# Patient Record
Sex: Female | Born: 1935 | Marital: Single | Smoking: Never smoker
Health system: Southern US, Community
[De-identification: ages and names within clinical notes are randomized; demographics above are authoritative.]

## PROBLEM LIST (undated history)

## (undated) DIAGNOSIS — N039 Chronic nephritic syndrome with unspecified morphologic changes: Secondary | ICD-10-CM

## (undated) DIAGNOSIS — G4700 Insomnia, unspecified: Secondary | ICD-10-CM

## (undated) DIAGNOSIS — D631 Anemia in chronic kidney disease: Secondary | ICD-10-CM

## (undated) DIAGNOSIS — N189 Chronic kidney disease, unspecified: Secondary | ICD-10-CM

## (undated) DIAGNOSIS — F0280 Dementia in other diseases classified elsewhere without behavioral disturbance: Secondary | ICD-10-CM

## (undated) DIAGNOSIS — G309 Alzheimer's disease, unspecified: Secondary | ICD-10-CM

## (undated) DIAGNOSIS — M1990 Unspecified osteoarthritis, unspecified site: Secondary | ICD-10-CM

## (undated) DIAGNOSIS — I150 Renovascular hypertension: Secondary | ICD-10-CM

## (undated) HISTORY — DX: Insomnia, unspecified: G47.00

## (undated) HISTORY — DX: Anemia in chronic kidney disease: D63.1

## (undated) HISTORY — DX: Unspecified osteoarthritis, unspecified site: M19.90

## (undated) HISTORY — DX: Chronic kidney disease, unspecified: N18.9

## (undated) HISTORY — DX: Renovascular hypertension: I15.0

## (undated) HISTORY — DX: Chronic nephritic syndrome with unspecified morphologic changes: N03.9

## (undated) HISTORY — DX: Dementia in other diseases classified elsewhere without behavioral disturbance: F02.80

## (undated) HISTORY — DX: Hypercalcemia: E83.52

## (undated) HISTORY — DX: Alzheimer's disease, unspecified: G30.9

---

## 2012-11-22 ENCOUNTER — Non-Acute Institutional Stay (SKILLED_NURSING_FACILITY): Payer: Medicare Other | Admitting: Adult Health

## 2012-11-22 DIAGNOSIS — M1990 Unspecified osteoarthritis, unspecified site: Secondary | ICD-10-CM

## 2012-11-22 DIAGNOSIS — G309 Alzheimer's disease, unspecified: Secondary | ICD-10-CM

## 2012-11-22 DIAGNOSIS — I150 Renovascular hypertension: Secondary | ICD-10-CM

## 2012-11-22 DIAGNOSIS — F411 Generalized anxiety disorder: Secondary | ICD-10-CM

## 2012-11-22 DIAGNOSIS — G4700 Insomnia, unspecified: Secondary | ICD-10-CM

## 2012-11-22 DIAGNOSIS — F419 Anxiety disorder, unspecified: Secondary | ICD-10-CM

## 2013-01-02 ENCOUNTER — Non-Acute Institutional Stay (SKILLED_NURSING_FACILITY): Payer: Medicare Other | Admitting: Internal Medicine

## 2013-01-02 DIAGNOSIS — D631 Anemia in chronic kidney disease: Secondary | ICD-10-CM | POA: Insufficient documentation

## 2013-01-02 DIAGNOSIS — F0280 Dementia in other diseases classified elsewhere without behavioral disturbance: Secondary | ICD-10-CM | POA: Insufficient documentation

## 2013-01-02 DIAGNOSIS — N039 Chronic nephritic syndrome with unspecified morphologic changes: Secondary | ICD-10-CM

## 2013-01-02 DIAGNOSIS — G4700 Insomnia, unspecified: Secondary | ICD-10-CM

## 2013-01-02 DIAGNOSIS — I150 Renovascular hypertension: Secondary | ICD-10-CM

## 2013-01-02 DIAGNOSIS — G309 Alzheimer's disease, unspecified: Secondary | ICD-10-CM

## 2013-01-02 HISTORY — DX: Anemia in chronic kidney disease: D63.1

## 2013-01-02 HISTORY — DX: Insomnia, unspecified: G47.00

## 2013-01-02 HISTORY — DX: Dementia in other diseases classified elsewhere, unspecified severity, without behavioral disturbance, psychotic disturbance, mood disturbance, and anxiety: F02.80

## 2013-01-02 HISTORY — DX: Renovascular hypertension: I15.0

## 2013-01-02 NOTE — Progress Notes (Signed)
PROGRESS NOTE  DATE: 01/02/2013  FACILITY: Nursing Home Location: [facility]  LEVEL OF CARE: SNF (31)  Routine Visit  CHIEF COMPLAINT:  Manage Alzheimer's dementia, hypertension and insomnia  HISTORY OF PRESENT ILLNESS:  REASSESSMENT OF ONGOING PROBLEM(S):  1. DEMENTIA: The dementia remaines stable and continues to function adequately in the current living environment with supervision.  The patient has had little changes in behavior. No complications noted from the medications presently being used. Dementia is advanced.  Patient is a poor historian.  2. HTN: Pt 's HTN remains stable.  Staff Deny CP, sob, DOE, pedal edema, headaches, dizziness or visual disturbances.  No complications from the medications currently being used.  Last BP : 112/68.  3. INSOMNIA: The insomnia remains stable.  No complications noted from the medications presently being used. staff deny ongoing insomnia, pain, hallucinations, delusions.  PAST MEDICAL HISTORY : Reviewed.  No changes.  CURRENT MEDICATIONS: Reviewed per [facility]  REVIEW OF SYSTEMS: Unobtainable due to dementia.  PHYSICAL EXAMINATION  VS:  T 98.9      P 96      RR 18      BP 112/68     POX %     WT (Lb) 107  GENERAL: no acute distress, thin body habitus EYES: conjunctivae normal, sclerae normal, normal eye lids NECK: supple, trachea midline, no neck masses, no thyroid tenderness, no thyromegaly LYMPHATICS: no LAN in the neck, no supraclavicular LAN RESPIRATORY: breathing is even & unlabored, BS CTAB CARDIAC: RRR, no murmur,no extra heart sounds, no edema GI: abdomen soft, normal BS, no masses, no tenderness, no hepatomegaly, no splenomegaly PSYCHIATRIC: the patient is alert & disoriented, affect & behavior appropriate  LABS/RADIOLOGY:  2/14 creatinine 1.31, BUN 17, Depakote level 37 12/13 hemoglobin 11.6, MCV 81 otherwise CBC normal, creatinine 1.43 otherwise CMP normal  ASSESSMENT/PLAN:  1. Alzheimer's  dementia-advanced. 2. Hypertension-well controlled. 3. insomnia -continue trazodone. 4. anemia of chronic kidney disease-recheck hemoglobin level. 5. osteoarthritis-appears comfortable. 6. chronic kidney disease-stable. 7. anxiety-stable. 8. check CBC and CMP.  CPT CODE: 91478

## 2013-01-16 ENCOUNTER — Non-Acute Institutional Stay (SKILLED_NURSING_FACILITY): Payer: Medicare Other | Admitting: Internal Medicine

## 2013-01-16 DIAGNOSIS — N039 Chronic nephritic syndrome with unspecified morphologic changes: Secondary | ICD-10-CM

## 2013-01-16 DIAGNOSIS — D631 Anemia in chronic kidney disease: Secondary | ICD-10-CM

## 2013-01-16 DIAGNOSIS — N189 Chronic kidney disease, unspecified: Secondary | ICD-10-CM

## 2013-02-07 ENCOUNTER — Non-Acute Institutional Stay (SKILLED_NURSING_FACILITY): Payer: Medicare Other | Admitting: Adult Health

## 2013-02-07 DIAGNOSIS — G4700 Insomnia, unspecified: Secondary | ICD-10-CM

## 2013-02-07 DIAGNOSIS — F411 Generalized anxiety disorder: Secondary | ICD-10-CM

## 2013-02-07 DIAGNOSIS — M1990 Unspecified osteoarthritis, unspecified site: Secondary | ICD-10-CM

## 2013-02-07 DIAGNOSIS — F419 Anxiety disorder, unspecified: Secondary | ICD-10-CM

## 2013-02-07 DIAGNOSIS — I150 Renovascular hypertension: Secondary | ICD-10-CM

## 2013-02-07 DIAGNOSIS — G309 Alzheimer's disease, unspecified: Secondary | ICD-10-CM

## 2013-02-10 NOTE — Progress Notes (Signed)
Patient ID: Andrea Robbins, female   DOB: Jun 11, 1934, 79 y.o.   MRN: [id number]        PROGRESS NOTE  DATE: 01/16/2013  FACILITY:  [facility]  LEVEL OF CARE: SNF (31)  Acute Visit  CHIEF COMPLAINT:  Manage renal insufficiency and hypercalcemia.    HISTORY OF PRESENT ILLNESS: I was requested by the staff to assess the patient regarding above problem(s):  CHRONIC KIDNEY DISEASE: The patient's chronic kidney disease remains stable.  Staff denies increasing lower extremity swelling or confusion. Last BUN and creatinine are:  On 01/09/2013:  BUN 26, creatinine 1.39.  In 09/2012:  Creatinine 1.31.  Patient is a poor historian due to dementia.    HYPERCALCEMIA:  New problem.  On 01/09/2013:  Calcium 10.9.  In 08/2012:  Calcium 9.8.  The patient is  currently on Caltrate.    PAST MEDICAL HISTORY : Reviewed.  No changes.  CURRENT MEDICATIONS: Reviewed per [facility]  REVIEW OF SYSTEMS:  Unobtainable due to dementia.    PHYSICAL EXAMINATION  GENERAL: no acute distress, normal body habitus EYES: conjunctivae normal, sclerae normal, normal eye lids NECK: supple, trachea midline, no neck masses, no thyroid tenderness, no thyromegaly LYMPHATICS: no LAN in the neck, no supraclavicular LAN RESPIRATORY: breathing is even & unlabored, BS CTAB CARDIAC: RRR, no murmur,no extra heart sounds, no edema GI: abdomen soft, normal BS, no masses, no tenderness, no hepatomegaly, no splenomegaly PSYCHIATRIC: the patient is alert, disoriented, affect & behavior appropriate  LABS/RADIOLOGY: 01/09/2013:  MCV 78, hemoglobin 12.9.    07/2012:  Hemoglobin 11.6, MCV 81.  ASSESSMENT/PLAN:  Chronic kidney disease.  Stable.    Hypercalcemia.  New problem.  Discontinue Caltrate and reassess.  Microcytic anemia.   Check iron studies.    CPT CODE: 40981

## 2013-02-11 DIAGNOSIS — N189 Chronic kidney disease, unspecified: Secondary | ICD-10-CM | POA: Insufficient documentation

## 2013-02-11 HISTORY — DX: Hypercalcemia: E83.52

## 2013-02-11 HISTORY — DX: Chronic kidney disease, unspecified: N18.9

## 2013-02-28 ENCOUNTER — Other Ambulatory Visit: Payer: Self-pay | Admitting: Geriatric Medicine

## 2013-02-28 MED ORDER — LORAZEPAM 1 MG PO TABS: ORAL_TABLET | ORAL | Status: AC

## 2013-03-06 ENCOUNTER — Non-Acute Institutional Stay (SKILLED_NURSING_FACILITY): Payer: Medicare Other | Admitting: Internal Medicine

## 2013-03-06 DIAGNOSIS — G309 Alzheimer's disease, unspecified: Secondary | ICD-10-CM

## 2013-03-06 DIAGNOSIS — N039 Chronic nephritic syndrome with unspecified morphologic changes: Secondary | ICD-10-CM

## 2013-03-06 DIAGNOSIS — I150 Renovascular hypertension: Secondary | ICD-10-CM

## 2013-03-06 DIAGNOSIS — G4700 Insomnia, unspecified: Secondary | ICD-10-CM

## 2013-03-06 DIAGNOSIS — D631 Anemia in chronic kidney disease: Secondary | ICD-10-CM

## 2013-03-06 NOTE — Progress Notes (Signed)
PROGRESS NOTE  DATE: 03-06-13  FACILITY: Nursing Home [facility]: [facility]  LEVEL OF CARE: SNF (31)  Routine Visit  CHIEF COMPLAINT:  Manage Alzheimer's dementia, hypertension and insomnia  HISTORY OF PRESENT ILLNESS:  REASSESSMENT OF ONGOING PROBLEM(S):  DEMENTIA: The dementia remaines stable and continues to function adequately in the current living environment with supervision.  The patient has had little changes in behavior. No complications noted from the medications presently being used. Dementia is advanced.  Patient is a poor historian.  HTN: Pt 's HTN remains stable.  Staff Deny CP, sob, DOE, pedal edema, headaches, dizziness or visual disturbances.  No complications from the medications currently being used.  Last BP : 112/68, 152/72  INSOMNIA: The insomnia remains stable.  No complications noted from the medications presently being used. staff deny ongoing insomnia, pain, hallucinations, delusions.  PAST MEDICAL HISTORY : Reviewed.  No changes.  CURRENT MEDICATIONS: Reviewed per [facility]  REVIEW OF SYSTEMS: Unobtainable due to dementia.  PHYSICAL EXAMINATION  VS:  T 96.4      P 89      RR 20      BP 152/72     POX %     WT (Lb) 104  GENERAL: no acute distress, thin body habitus NECK: supple, trachea midline, no neck masses, no thyroid tenderness, no thyromegaly RESPIRATORY: breathing is even & unlabored, BS CTAB CARDIAC: RRR, no murmur,no extra heart sounds, no edema GI: abdomen soft, normal BS, no masses, no tenderness, no hepatomegaly, no splenomegaly PSYCHIATRIC: the patient is alert & disoriented, affect & behavior appropriate  LABS/RADIOLOGY:  7/14 depakote level 28 6/14 TIBC 234 ow iron panel nl, mcv 78 ow cbc nl, cr 1.39, Ca 10.9 ow cmp nl  2/14 creatinine 1.31, BUN 17, Depakote level 37 12/13 hemoglobin 11.6, MCV 81 otherwise CBC normal, creatinine 1.43 otherwise CMP normal  ASSESSMENT/PLAN:  Alzheimer's dementia-advanced.  Depakote  was increased. Hypertension-BP elevated.  Will review a BP log. insomnia -continue trazodone. anemia of chronic kidney disease- hemoglobin has normalized. osteoarthritis-appears comfortable. chronic kidney disease-stable. anxiety-stable. Hypercalcemia-recheck.  CPT CODE: 16109

## 2013-04-05 ENCOUNTER — Non-Acute Institutional Stay (SKILLED_NURSING_FACILITY): Payer: Medicare Other | Admitting: Internal Medicine

## 2013-04-05 DIAGNOSIS — F0280 Dementia in other diseases classified elsewhere without behavioral disturbance: Secondary | ICD-10-CM

## 2013-04-05 DIAGNOSIS — G4700 Insomnia, unspecified: Secondary | ICD-10-CM

## 2013-04-05 DIAGNOSIS — I150 Renovascular hypertension: Secondary | ICD-10-CM

## 2013-04-05 DIAGNOSIS — M1990 Unspecified osteoarthritis, unspecified site: Secondary | ICD-10-CM

## 2013-04-06 DIAGNOSIS — M1990 Unspecified osteoarthritis, unspecified site: Secondary | ICD-10-CM | POA: Insufficient documentation

## 2013-04-06 HISTORY — DX: Unspecified osteoarthritis, unspecified site: M19.90

## 2013-04-06 NOTE — Progress Notes (Signed)
PROGRESS NOTE  DATE: 04-05-13  FACILITY: Nursing Home Location: [facility]  LEVEL OF CARE: SNF (31)  Routine Visit  CHIEF COMPLAINT:  Manage Alzheimer's dementia, hypertension and insomnia  HISTORY OF PRESENT ILLNESS:  REASSESSMENT OF ONGOING PROBLEM(S):  DEMENTIA: The dementia remaines stable and continues to function adequately in the current living environment with supervision.  The patient has had little changes in behavior. No complications noted from the medications presently being used. Dementia is advanced.  Patient is a poor historian.  HTN: Pt 's HTN remains stable.  Staff Deny CP, sob, DOE, pedal edema, headaches, dizziness or visual disturbances.  No complications from the medications currently being used.  Last BP : 112/68, 152/72, 110/80  INSOMNIA: The insomnia remains stable.  No complications noted from the medications presently being used. staff deny ongoing insomnia, pain, hallucinations, delusions.  PAST MEDICAL HISTORY : Reviewed.  No changes.  CURRENT MEDICATIONS: Reviewed per [facility]  REVIEW OF SYSTEMS: Unobtainable due to dementia.  PHYSICAL EXAMINATION  VS:  T 97.3      P 81      RR 18      BP 110/80     POX %     WT (Lb) 107  GENERAL: no acute distress, thin body habitus NECK: supple, trachea midline, no neck masses, no thyroid tenderness, no thyromegaly RESPIRATORY: breathing is even & unlabored, BS CTAB CARDIAC: RRR, no murmur,no extra heart sounds, no edema GI: abdomen soft, normal BS, no masses, no tenderness, no hepatomegaly, no splenomegaly PSYCHIATRIC: the patient is alert & disoriented, affect & behavior appropriate  LABS/RADIOLOGY:  7/14 depakote level 28 6/14 TIBC 234 ow iron panel nl, mcv 78 ow cbc nl, cr 1.39, Ca 10.9 ow cmp nl  2/14 creatinine 1.31, BUN 17, Depakote level 37 12/13 hemoglobin 11.6, MCV 81 otherwise CBC normal, creatinine 1.43 otherwise CMP normal  ASSESSMENT/PLAN:  Alzheimer's dementia-advanced.  Seroquel was started. Hypertension-well controlled. insomnia -continue trazodone. osteoarthritis-appears comfortable. chronic kidney disease-stable. anxiety-stable. Hypercalcemia-recheck.  CPT CODE: 86578

## 2013-04-30 ENCOUNTER — Non-Acute Institutional Stay (SKILLED_NURSING_FACILITY): Payer: Medicare Other | Admitting: Adult Health

## 2013-04-30 DIAGNOSIS — M1990 Unspecified osteoarthritis, unspecified site: Secondary | ICD-10-CM

## 2013-04-30 DIAGNOSIS — M79609 Pain in unspecified limb: Secondary | ICD-10-CM

## 2013-04-30 DIAGNOSIS — M79604 Pain in right leg: Secondary | ICD-10-CM

## 2013-05-09 ENCOUNTER — Non-Acute Institutional Stay (SKILLED_NURSING_FACILITY): Payer: Medicare Other | Admitting: Internal Medicine

## 2013-05-09 DIAGNOSIS — M1990 Unspecified osteoarthritis, unspecified site: Secondary | ICD-10-CM

## 2013-05-09 DIAGNOSIS — I150 Renovascular hypertension: Secondary | ICD-10-CM

## 2013-05-09 DIAGNOSIS — G4700 Insomnia, unspecified: Secondary | ICD-10-CM

## 2013-05-09 DIAGNOSIS — F0280 Dementia in other diseases classified elsewhere without behavioral disturbance: Secondary | ICD-10-CM

## 2013-05-09 NOTE — Progress Notes (Signed)
PROGRESS NOTE  DATE: 05-09-13  FACILITY: Nursing Home [facility]: [facility]  LEVEL OF CARE: SNF (31)  Routine Visit  CHIEF COMPLAINT:  Manage Alzheimer's dementia, hypertension and insomnia  HISTORY OF PRESENT ILLNESS:  REASSESSMENT OF ONGOING PROBLEM(S):  DEMENTIA: The dementia remaines stable and continues to function adequately in the current living environment with supervision.  The patient has had little changes in behavior. No complications noted from the medications presently being used. Dementia is advanced.  Patient is a poor historian.  HTN: Pt 's HTN remains stable.  Staff Deny CP, sob, DOE, pedal edema, headaches, dizziness or visual disturbances.  No complications from the medications currently being used.  Last BP : 112/68, 152/72, 110/80, 128/92  INSOMNIA: The insomnia remains stable.  No complications noted from the medications presently being used. staff deny ongoing insomnia, pain, hallucinations, delusions.  PAST MEDICAL HISTORY : Reviewed.  No changes.  CURRENT MEDICATIONS: Reviewed per [facility]  REVIEW OF SYSTEMS: Unobtainable due to dementia.  PHYSICAL EXAMINATION  VS:  T 97.3      P 86      RR 16      BP 128/92    POX %     WT (Lb) 112  GENERAL: no acute distress, thin body habitus NECK: supple, trachea midline, no neck masses, no thyroid tenderness, no thyromegaly RESPIRATORY: breathing is even & unlabored, BS CTAB CARDIAC: RRR, no murmur,no extra heart sounds, no edema GI: abdomen soft, normal BS, no masses, no tenderness, no hepatomegaly, no splenomegaly PSYCHIATRIC: the patient is alert & disoriented, affect & behavior appropriate  LABS/RADIOLOGY:  8/14 Ca 10.2  7/14 depakote level 28 6/14 TIBC 234 ow iron panel nl, mcv 78 ow cbc nl, cr 1.39, Ca 10.9 ow cmp nl  2/14 creatinine 1.31, BUN 17, Depakote level 37 12/13 hemoglobin 11.6, MCV 81 otherwise CBC normal, creatinine 1.43 otherwise CMP  normal  ASSESSMENT/PLAN:  Alzheimer's dementia-advanced.  Hypertension-well controlled. insomnia -continue trazodone. osteoarthritis-appears comfortable. chronic kidney disease-stable. anxiety-stable.  CPT CODE: 40981

## 2013-06-13 ENCOUNTER — Non-Acute Institutional Stay (SKILLED_NURSING_FACILITY): Payer: Medicare Other | Admitting: Internal Medicine

## 2013-06-13 DIAGNOSIS — G4700 Insomnia, unspecified: Secondary | ICD-10-CM

## 2013-06-13 DIAGNOSIS — M1990 Unspecified osteoarthritis, unspecified site: Secondary | ICD-10-CM

## 2013-06-13 DIAGNOSIS — I150 Renovascular hypertension: Secondary | ICD-10-CM

## 2013-06-13 DIAGNOSIS — F0280 Dementia in other diseases classified elsewhere without behavioral disturbance: Secondary | ICD-10-CM

## 2013-06-15 ENCOUNTER — Encounter: Payer: Self-pay | Admitting: Internal Medicine

## 2013-06-15 NOTE — Addendum Note (Signed)
Addended by: Angela Cox on: 06/15/2013 12:33 PM   Modules accepted: Orders

## 2013-06-15 NOTE — Progress Notes (Signed)
PROGRESS NOTE  DATE: 06-13-13  FACILITY: Nursing Home [facility]: [facility]  LEVEL OF CARE: SNF (31)  Routine Visit  CHIEF COMPLAINT:  Manage Alzheimer's dementia, hypertension and insomnia  HISTORY OF PRESENT ILLNESS:  REASSESSMENT OF ONGOING PROBLEM(S):  DEMENTIA: The dementia remaines stable and continues to function adequately in the current living environment with supervision.  The patient has had little changes in behavior. No complications noted from the medications presently being used. Dementia is advanced.  Patient is a poor historian.  HTN: Pt 's HTN remains stable.  Staff Deny CP, sob, DOE, pedal edema, headaches, dizziness or visual disturbances.  No complications from the medications currently being used.  Last BP : 112/68, 152/72, 110/80, 128/92, 136/86 he  INSOMNIA: The insomnia remains stable.  No complications noted from the medications presently being used. staff deny ongoing insomnia, pain, hallucinations, delusions.  PAST MEDICAL HISTORY : Reviewed.  No changes.  CURRENT MEDICATIONS: Reviewed per [facility]  REVIEW OF SYSTEMS: Unobtainable due to dementia.  PHYSICAL EXAMINATION  VS:  T 97.2     P 82      RR 18     BP 136/86   POX %     WT (Lb) 112  GENERAL: no acute distress, thin body habitus NECK: supple, trachea midline, no neck masses, no thyroid tenderness, no thyromegaly RESPIRATORY: breathing is even & unlabored, BS CTAB CARDIAC: RRR, no murmur,no extra heart sounds, no edema GI: abdomen soft, normal BS, no masses, no tenderness, no hepatomegaly, no splenomegaly PSYCHIATRIC: the patient is alert & disoriented, affect & behavior appropriate  LABS/RADIOLOGY:  8/14 Ca 10.2  7/14 depakote level 28 6/14 TIBC 234 ow iron panel nl, mcv 78 ow cbc nl, cr 1.39, Ca 10.9 ow cmp nl  2/14 creatinine 1.31, BUN 17, Depakote level 37 12/13 hemoglobin 11.6, MCV 81 otherwise CBC normal, creatinine 1.43 otherwise CMP  normal  ASSESSMENT/PLAN:  Alzheimer's dementia-advanced.  Hypertension-well controlled. insomnia -continue trazodone. osteoarthritis-appears comfortable. chronic kidney disease-stable. anxiety-stable.  CPT CODE: 16109

## 2013-07-01 ENCOUNTER — Encounter: Payer: Self-pay | Admitting: Adult Health

## 2013-07-01 NOTE — Progress Notes (Signed)
Patient ID: Andrea Robbins, female   DOB: [birth year], 79 y.o.   MRN: [id number]     [facility]  Allergies no known allergies  Chief Complaint  Patient presents with  . Medical Managment of Chronic Issues    HPI  She is being seen for the management of her chronic illnesses. There have been no recent changes in her overall status. There are no concerns being voiced by the nursing staff at this time. She is unable to full participate in the hpi or ros.    Past Medical History  Diagnosis Date  . Secondary renovascular hypertension, benign   . Alzheimer's disease   . Osteoarthrosis, unspecified whether generalized or localized, unspecified site   . Chronic kidney disease, unspecified   . Anemia in chronic kidney disease(285.21)   . Hypercalcemia   . Insomnia, unspecified   '  No past surgical history on file.  Filed Vitals:   11/22/12 1227  BP: 118/76  Pulse: 60  Weight: 107 lb (48.535 kg)    MEDICATIONS  Verapamil er 360 mg daily mobic 15 mg diay mvi daiyl Ca++ 600/400 twice daily depakote sprinkles 250 mg twice daily Ativan 1 mg twice daily and 0.5 mg daily prn  Trazodone 50 mg nightly and may repeat prn  LABS REVIEWED:   07-19-12: wbc 6.0; hgb 11.6; hct 35.7 ;mcv 81; plt 312; glucose 74; bun 22; creat 1.45; k+4.5; na++144 Liver normal albumin 3.7 09-12-12: depakote 37   Review of Systems  Unable to perform ROS   Physical Exam  Constitutional: No distress.  frail  Neck: Neck supple. No JVD present.  Cardiovascular: Normal rate, regular rhythm and intact distal pulses.   Respiratory: Effort normal and breath sounds normal. No respiratory distress. She has no wheezes.  GI: Soft. Bowel sounds are normal. She exhibits no distension.  Musculoskeletal: Normal range of motion.  Neurological: She is alert.  Skin: Skin is warm and dry. She is not diaphoretic.    ASSESSMENT/PLAN  1. Hypertension: is stable will continue verapamil er 360 mg daily and will  monitor her status   2. Osteoarthritis: she is presently being managed will continue her mobic 15 mg daily and will monitor   3. Alzheimer's disease: is without change in status; will continue her depakote 250 mg twice daily to help stabilize her mood and will monitor her status  4. Anxiety: will continue ativan 1 mg twice daily and 0.5 mg daily as needed  5. Insomnia: will continue trazodone 50 mg nightly with an as needed dose of 50 mg

## 2013-07-04 ENCOUNTER — Non-Acute Institutional Stay (SKILLED_NURSING_FACILITY): Payer: Medicare Other | Admitting: Internal Medicine

## 2013-07-04 DIAGNOSIS — G4700 Insomnia, unspecified: Secondary | ICD-10-CM

## 2013-07-04 DIAGNOSIS — M1990 Unspecified osteoarthritis, unspecified site: Secondary | ICD-10-CM

## 2013-07-04 DIAGNOSIS — F0280 Dementia in other diseases classified elsewhere without behavioral disturbance: Secondary | ICD-10-CM

## 2013-07-04 DIAGNOSIS — I150 Renovascular hypertension: Secondary | ICD-10-CM

## 2013-07-05 ENCOUNTER — Encounter: Payer: Self-pay | Admitting: Internal Medicine

## 2013-07-05 NOTE — Progress Notes (Signed)
PROGRESS NOTE  DATE: 07-04-13  FACILITY: Nursing Home [facility]: [facility]  LEVEL OF CARE: SNF (31)  Routine Visit  CHIEF COMPLAINT:  Manage Alzheimer's dementia, hypertension and insomnia  HISTORY OF PRESENT ILLNESS:  REASSESSMENT OF ONGOING PROBLEM(S):  DEMENTIA: The dementia remaines stable and continues to function adequately in the current living environment with supervision.  The patient has had little changes in behavior. No complications noted from the medications presently being used. Dementia is advanced.  Patient is a poor historian.  HTN: Pt 's HTN remains stable.  Staff Deny CP, sob, DOE, pedal edema, headaches, dizziness or visual disturbances.  No complications from the medications currently being used.  Last BP : 112/68, 152/72, 110/80, 128/92, 136/86  INSOMNIA: The insomnia remains stable.  No complications noted from the medications presently being used. staff deny ongoing insomnia, pain, hallucinations, delusions.  PAST MEDICAL HISTORY : Reviewed.  No changes.  CURRENT MEDICATIONS: Reviewed per [facility]  REVIEW OF SYSTEMS: Unobtainable due to dementia.  PHYSICAL EXAMINATION  VS:  T 97.2     P 82      RR 18     BP 136/86   POX %     WT (Lb) 112  GENERAL: no acute distress, thin body habitus NECK: supple, trachea midline, no neck masses, no thyroid tenderness, no thyromegaly RESPIRATORY: breathing is even & unlabored, BS CTAB CARDIAC: RRR, no murmur,no extra heart sounds, no edema GI: abdomen soft, normal BS, no masses, no tenderness, no hepatomegaly, no splenomegaly PSYCHIATRIC: the patient is alert & disoriented, affect & behavior appropriate  LABS/RADIOLOGY:  8/14 Ca 10.2  7/14 depakote level 28 6/14 TIBC 234 ow iron panel nl, mcv 78 ow cbc nl, cr 1.39, Ca 10.9 ow cmp nl  2/14 creatinine 1.31, BUN 17, Depakote level 37 12/13 hemoglobin 11.6, MCV 81 otherwise CBC normal, creatinine 1.43 otherwise CMP  normal  ASSESSMENT/PLAN:  Alzheimer's dementia-advanced.  Hypertension-well controlled. insomnia -continue trazodone. osteoarthritis-appears comfortable. chronic kidney disease-stable. Check creatinine level anxiety-stable. Check CBC and CMP  CPT CODE: 16109

## 2013-07-09 NOTE — Progress Notes (Signed)
Patient ID: Andrea Robbins, female   DOB: [birth year], 79 y.o.   MRN: [id number]      [facility]  Allergies no known allergies  Chief Complaint  Patient presents with  . Medical Managment of Chronic Issues   HPI  She is being seen for the management of her chronic illnesses. There have been no recent changes in her overall status. There are no concerns being voiced by the nursing staff at this time. She is unable to full participate in the hpi or ros.    Past Medical History  Diagnosis Date  . Secondary renovascular hypertension, benign   . Alzheimer's disease   . Osteoarthrosis, unspecified whether generalized or localized, unspecified site   . Chronic kidney disease, unspecified   . Anemia in chronic kidney disease(285.21)   . Hypercalcemia   . Insomnia, unspecified   '  No past surgical history on file.  Filed Vitals:   02/07/13 1348  BP: 142/89  Pulse: 83  Height: 5\' 5"  (1.651 m)  Weight: 104 lb (47.174 kg)     MEDICATIONS  Verapamil er 360 mg daily mobic 15 mg diay mvi daily depakote sprinkles 250 mg twice daily Ativan 1 mg twice daily and 0.5 mg daily prn    LABS REVIEWED:   07-19-12: wbc 6.0; hgb 11.6; hct 35.7 ;mcv 81; plt 312; glucose 74; bun 22; creat 1.45; k+4.5; na++144 Liver normal albumin 3.7 09-12-12: depakote 37  01-10-13: wbc 7.1; hgb 12.9; ct 40.3; mcv 78; plt 259; glucose 70; bun 26; creat 1.39; k+4.3; na++142 Liver normal albumin 4.1; ca++ 10.9     Review of Systems  Unable to perform ROS   Physical Exam  Constitutional: No distress.  frail  Neck: Neck supple. No JVD present.  Cardiovascular: Normal rate, regular rhythm and intact distal pulses.   Respiratory: Effort normal and breath sounds normal. No respiratory distress. She has no wheezes.  GI: Soft. Bowel sounds are normal. She exhibits no distension.  Musculoskeletal: Normal range of motion.  Neurological: She is alert.  Skin: Skin is warm and dry. She is not diaphoretic.     ASSESSMENT/PLAN  1. Hypertension: is stable will continue verapamil er 360 mg daily and will monitor her status   2. Osteoarthritis: she is presently being managed will continue her mobic 15 mg daily and will monitor   3. Alzheimer's disease: is without change in status; will continue her depakote 250 mg twice daily to help stabilize her mood and will monitor her status  4. Anxiety: will continue ativan 1 mg twice daily is presently stable   5. Insomnia: will not make changes is presently not on medications. Will monitor    6. Hypercalcemia: no change in her status; will continued to monitor

## 2013-07-11 ENCOUNTER — Non-Acute Institutional Stay (SKILLED_NURSING_FACILITY): Payer: Medicare Other | Admitting: Internal Medicine

## 2013-07-11 DIAGNOSIS — N189 Chronic kidney disease, unspecified: Secondary | ICD-10-CM

## 2013-07-11 NOTE — Progress Notes (Signed)
Patient ID: Andrea Robbins, female   DOB: 1934/07/15, 79 y.o.   MRN: [id number]     [facility]  Allergies no known allergies  Chief Complaint  Patient presents with  . Acute Visit    right leg pain    HPI Nursing has asked me to see her for her right leg pain. She is having increased difficulty participating in therapy she is resistant to movement of her leg. She does not want to stand on leg. There is no swelling or other signs of injury present. There are no known falls present.   Past Medical History  Diagnosis Date  . Secondary renovascular hypertension, benign 01/02/2013  . Alzheimer's disease 01/02/2013  . Osteoarthrosis, unspecified whether generalized or localized, unspecified site 04/06/2013  . Chronic kidney disease, unspecified 02/11/2013  . Anemia in chronic kidney disease(285.21) 01/02/2013  . Hypercalcemia 02/11/2013  . Insomnia, unspecified 01/02/2013    No past surgical history on file.  Filed Vitals:   04/30/13 2142  BP: 127/79  Pulse: 80  Height: 5\' 5"  (1.651 m)  Weight: 104 lb (47.174 kg)      MEDICATIONS  Verapamil er 360 mg daily mobic 15 mg diay mvi daily depakote sprinkles 250 mg twice daily Ativan 1 mg twice daily and 0.5 mg daily prn  Trazodone 25 mg tid prn   LABS REVIEWED:   07-19-12: wbc 6.0; hgb 11.6; hct 35.7 ;mcv 81; plt 312; glucose 74; bun 22; creat 1.45; k+4.5; na++144 Liver normal albumin 3.7 09-12-12: depakote 37  01-10-13: wbc 7.1; hgb 12.9; ct 40.3; mcv 78; plt 259; glucose 70; bun 26; creat 1.39; k+4.3; na++142 Liver normal albumin 4.1; ca++ 10.9  01-21-13: tibc 234; iron 45; ca++ 9.9; ferritin 258 02-06-13: depakote 28 03-14-13: ca++ 10.2    Review of Systems  Unable to perform ROS   Physical Exam  Constitutional: No distress.  frail  Neck: Neck supple. No JVD present.  Cardiovascular: Normal rate, regular rhythm and intact distal pulses.   Respiratory: Effort normal and breath sounds normal. No respiratory distress. She has  no wheezes.  GI: Soft. Bowel sounds are normal. She exhibits no distension.  Musculoskeletal: right leg is resistant to movement; does not want to stand on leg; there are no palpable abnormalities present. No edema present.  Neurological: She is alert.  Skin: Skin is warm and dry. She is not diaphoretic.   ASSESSMENT/PLAN  1. Right leg pain: will begin tylenol 1 gm tid and will continue to monitor her status; this pain is more than likely arthritic in nature.

## 2013-07-13 ENCOUNTER — Encounter: Payer: Self-pay | Admitting: Internal Medicine

## 2013-07-13 NOTE — Progress Notes (Signed)
PROGRESS NOTE  DATE: 07/11/2013  FACILITY:  [facility]  LEVEL OF CARE: SNF (31)  Acute Visit  CHIEF COMPLAINT:  Manage renal insufficiency  HISTORY OF PRESENT ILLNESS: I was requested by the staff to assess the patient regarding above problem(s):  CHRONIC KIDNEY DISEASE: The patient's chronic kidney disease remains stable.  Staff deny increasing lower extremity swelling or confusion. Last BUN and creatinine are: 27, 1.4 on 07-10-13.  In 6/14 creatinine 1.39. Patient is not on renal toxic medications. Patient is a poor historian due to dementia.  PAST MEDICAL HISTORY : Reviewed.  No changes.  CURRENT MEDICATIONS: Reviewed per [facility]  PHYSICAL EXAMINATION  GENERAL: no acute distress, thin body habitus RESPIRATORY: breathing is even & unlabored, BS CTAB CARDIAC: RRR, no murmur,no extra heart sounds, no edema  LABS/RADIOLOGY: See history of present illness  ASSESSMENT/PLAN:  Chronic kidney disease-renal function is worse. We will monitor.  CPT CODE: 08657

## 2013-08-15 ENCOUNTER — Non-Acute Institutional Stay (SKILLED_NURSING_FACILITY): Payer: Medicare Other | Admitting: Internal Medicine

## 2013-08-15 DIAGNOSIS — M1990 Unspecified osteoarthritis, unspecified site: Secondary | ICD-10-CM

## 2013-08-15 DIAGNOSIS — I150 Renovascular hypertension: Secondary | ICD-10-CM

## 2013-08-15 DIAGNOSIS — G309 Alzheimer's disease, unspecified: Principal | ICD-10-CM

## 2013-08-15 DIAGNOSIS — F0280 Dementia in other diseases classified elsewhere without behavioral disturbance: Secondary | ICD-10-CM

## 2013-08-15 DIAGNOSIS — G4700 Insomnia, unspecified: Secondary | ICD-10-CM

## 2013-08-15 NOTE — Progress Notes (Signed)
PROGRESS NOTE  DATE: 08-15-13  FACILITY: Nursing Home [facility]  LEVEL OF CARE: SNF (31)  Routine Visit  CHIEF COMPLAINT:  Manage Alzheimer's dementia, hypertension and insomnia  HISTORY OF PRESENT ILLNESS:  REASSESSMENT OF ONGOING PROBLEM(S):  DEMENTIA: The dementia remaines stable and continues to function adequately in the current living environment with supervision.  The patient has had little changes in behavior. No complications noted from the medications presently being used. Dementia is advanced.  Patient is a poor historian.  HTN: Pt 's HTN remains stable.  Staff Deny CP, sob, DOE, pedal edema, headaches, dizziness or visual disturbances.  No complications from the medications currently being used.  Last BP : 112/68, 152/72, 110/80, 128/92, 136/86,120/80  INSOMNIA: The insomnia remains stable.  No complications noted from the medications presently being used. staff deny ongoing insomnia, pain, hallucinations, delusions.  PAST MEDICAL HISTORY : Reviewed.  No changes.  [facility]S: [facility]  REVIEW OF SYSTEMS: Unobtainable due to dementia.  PHYSICAL EXAMINATION  VS:  T 97.3     P 72      RR 18     BP 120/80   POX %     WT (Lb) 115  GENERAL: no acute distress, thin body habitus NECK: supple, trachea midline, no neck masses, no thyroid tenderness, no thyromegaly RESPIRATORY: breathing is even & unlabored, BS CTAB CARDIAC: RRR, no murmur,no extra heart sounds, no edema GI: abdomen soft, normal BS, no masses, no tenderness, no hepatomegaly, no splenomegaly PSYCHIATRIC: the patient is alert & disoriented, affect & behavior appropriate  LABS/RADIOLOGY:  12-14 mcv 78 ow cbc nl, cr 1.4 ow cmp nl  8/14 Ca 10.2  7/14 depakote level 28 6/14 TIBC 234 ow iron panel nl, mcv 78 ow cbc nl, cr 1.39, Ca 10.9 ow cmp nl  2/14 creatinine 1.31, BUN 17, Depakote level 37 12/13 hemoglobin 11.6, MCV 81 otherwise CBC normal, creatinine  1.43 otherwise CMP normal  ASSESSMENT/PLAN:  Alzheimer's dementia-advanced. seroquel was d/cd Hypertension-well controlled. insomnia -continue trazodone. osteoarthritis-appears comfortable. chronic kidney disease-stable.  anxiety-stable. Check depakote level  CPT CODE: [id number]

## 2013-09-10 ENCOUNTER — Encounter: Payer: Self-pay | Admitting: *Deleted

## 2013-09-17 ENCOUNTER — Non-Acute Institutional Stay (SKILLED_NURSING_FACILITY): Payer: Medicare Other | Admitting: Internal Medicine

## 2013-09-17 DIAGNOSIS — N189 Chronic kidney disease, unspecified: Secondary | ICD-10-CM

## 2013-09-24 NOTE — Progress Notes (Signed)
Patient ID: Andrea Robbins, female   DOB: Aug 04, 1933, 80 y.o.   MRN: [id number]          PROGRESS NOTE  DATE: 09/17/2013    [facility]  LEVEL OF CARE: SNF (31)  Acute Visit  CHIEF COMPLAINT:  Manage renal insufficiency.    HISTORY OF PRESENT ILLNESS: I was requested by the staff to assess the patient regarding above problem(s):  On 09/10/2013:  BUN 24, creatinine 1.35.  In 07/2013:  Creatinine 1.4.  Patient is a poor historian due to dementia.    PAST MEDICAL HISTORY : Reviewed.  No changes.  CURRENT MEDICATIONS: [facility]  PHYSICAL EXAMINATION  VS:  T 96.8      P 74     RR 24     BP 144/98       WT (Lb) 114     GENERAL: no acute distress, thin body habitus RESPIRATORY: breathing is even & unlabored, BS CTAB CARDIAC: RRR, no murmur,no extra heart sounds, no edema  ASSESSMENT/PLAN:  Renal insufficiency.  Renal functions improved.    CPT CODE: [id number]     Angela CoxGayani Y Dorotha Hirschi, MD [facility] [phone]

## 2013-12-25 ENCOUNTER — Non-Acute Institutional Stay (SKILLED_NURSING_FACILITY): Payer: Medicare Other | Admitting: Internal Medicine

## 2013-12-25 DIAGNOSIS — G309 Alzheimer's disease, unspecified: Principal | ICD-10-CM

## 2013-12-25 DIAGNOSIS — F0280 Dementia in other diseases classified elsewhere without behavioral disturbance: Secondary | ICD-10-CM

## 2013-12-25 DIAGNOSIS — G4700 Insomnia, unspecified: Secondary | ICD-10-CM

## 2013-12-25 DIAGNOSIS — I150 Renovascular hypertension: Secondary | ICD-10-CM

## 2013-12-25 DIAGNOSIS — M1990 Unspecified osteoarthritis, unspecified site: Secondary | ICD-10-CM

## 2013-12-26 NOTE — Progress Notes (Signed)
        PROGRESS NOTE  DATE: 12-25-13  FACILITY: Nursing Home [facility]  LEVEL OF CARE: SNF (31)  Routine Visit  CHIEF COMPLAINT:  Manage Alzheimer's dementia, hypertension and insomnia  HISTORY OF PRESENT ILLNESS:  REASSESSMENT OF ONGOING PROBLEM(S):  DEMENTIA: The dementia remaines stable and continues to function adequately in the current living environment with supervision.  The patient has had little changes in behavior. No complications noted from the medications presently being used. Dementia is advanced.  Patient is a poor historian.  HTN: Pt 's HTN remains stable.  Staff Deny CP, sob, DOE, pedal edema, headaches, dizziness or visual disturbances.  No complications from the medications currently being used.  Last BP : 112/68, 152/72, 110/80, 128/92, 136/86,120/80, 148/90  INSOMNIA: The insomnia remains stable.  No complications noted from the medications presently being used. staff deny ongoing insomnia, pain, hallucinations, delusions.  PAST MEDICAL HISTORY : Reviewed.  No changes.  CURRENT MEDICATIONS: Reviewed per [facility]  REVIEW OF SYSTEMS: Unobtainable due to dementia.  PHYSICAL EXAMINATION  VS: see vital sign section  GENERAL: no acute distress, thin body habitus EYES: Normal sclerae, normal conjunctivae, no discharge NECK: supple, trachea midline, no neck masses, no thyroid tenderness, no thyromegaly LYMPHATICS: No cervical lymphadenopathy, no supraclavicular lymphadenopathy RESPIRATORY: breathing is even & unlabored, BS CTAB CARDIAC: RRR, no murmur,no extra heart sounds, no edema GI: abdomen soft, normal BS, no masses, no tenderness, no hepatomegaly, no splenomegaly PSYCHIATRIC: the patient is alert & disoriented, affect & behavior appropriate  LABS/RADIOLOGY: 2-15 CBC normal, creatinine 1.35 otherwise BMP normal, vitamin D level 37.3 1-15 Depakote level 48 12-14 mcv 78 ow cbc nl, cr 1.4 ow cmp nl  8/14 Ca 10.2  7/14 depakote  level 28 6/14 TIBC 234 ow iron panel nl, mcv 78 ow cbc nl, cr 1.39, Ca 10.9 ow cmp nl  2/14 creatinine 1.31, BUN 17, Depakote level 37 12/13 hemoglobin 11.6, MCV 81 otherwise CBC normal, creatinine 1.43 otherwise CMP normal  ASSESSMENT/PLAN:  Alzheimer's dementia-advanced. Depakote was increased. Hypertension-blood pressure borderline. We review a log insomnia -continue trazodone. osteoarthritis-appears comfortable. chronic kidney disease-stable.  anxiety-stable. Check depakote level  CPT CODE: 46962  Newton Pigg. Kerry Dory, MD [facility] [phone]

## 2014-01-15 ENCOUNTER — Non-Acute Institutional Stay (SKILLED_NURSING_FACILITY): Payer: Medicare Other | Admitting: Internal Medicine

## 2014-01-15 DIAGNOSIS — F0280 Dementia in other diseases classified elsewhere without behavioral disturbance: Secondary | ICD-10-CM

## 2014-01-15 DIAGNOSIS — I150 Renovascular hypertension: Secondary | ICD-10-CM

## 2014-01-15 DIAGNOSIS — G309 Alzheimer's disease, unspecified: Principal | ICD-10-CM

## 2014-01-15 DIAGNOSIS — G4700 Insomnia, unspecified: Secondary | ICD-10-CM

## 2014-01-15 DIAGNOSIS — M1990 Unspecified osteoarthritis, unspecified site: Secondary | ICD-10-CM

## 2014-01-16 NOTE — Progress Notes (Signed)
        PROGRESS NOTE  DATE: 01-15-14  FACILITY: Nursing Home [facility]  LEVEL OF CARE: SNF (31)  Routine Visit  CHIEF COMPLAINT:  Manage Alzheimer's dementia, hypertension and insomnia  HISTORY OF PRESENT ILLNESS:  REASSESSMENT OF ONGOING PROBLEM(S):  DEMENTIA: The dementia remaines stable and continues to function adequately in the current living environment with supervision.  The patient has had little changes in behavior. No complications noted from the medications presently being used. Dementia is advanced.  Patient is a poor historian.  HTN: Pt 's HTN remains stable.  Staff Deny CP, sob, DOE, pedal edema, headaches, dizziness or visual disturbances.  No complications from the medications currently being used.  Last BP : 112/68, 152/72, 110/80, 128/92, 136/86,120/80, 148/90, 140/68  INSOMNIA: The insomnia remains stable.  No complications noted from the medications presently being used. staff deny ongoing insomnia, pain, hallucinations, delusions.  PAST MEDICAL HISTORY : Reviewed.  No changes.  CURRENT MEDICATIONS: Reviewed per [facility]  REVIEW OF SYSTEMS: Unobtainable due to dementia.  PHYSICAL EXAMINATION  VS: see vital sign section  GENERAL: no acute distress, thin body habitus NECK: supple, trachea midline, no neck masses, no thyroid tenderness, no thyromegaly RESPIRATORY: breathing is even & unlabored, BS CTAB CARDIAC: RRR, no murmur,no extra heart sounds, no edema GI: abdomen soft, normal BS, no masses, no tenderness, no hepatomegaly, no splenomegaly PSYCHIATRIC: the patient is alert & disoriented, affect & behavior appropriate  LABS/RADIOLOGY: 6-15 Depakote level 44 2-15 CBC normal, creatinine 1.35 otherwise BMP normal, vitamin D level 37.3 1-15 Depakote level 48 12-14 mcv 78 ow cbc nl, cr 1.4 ow cmp nl  8/14 Ca 10.2  7/14 depakote level 28 6/14 TIBC 234 ow iron panel nl, mcv 78 ow cbc nl, cr 1.39, Ca 10.9 ow cmp nl  2/14 creatinine  1.31, BUN 17, Depakote level 37 12/13 hemoglobin 11.6, MCV 81 otherwise CBC normal, creatinine 1.43 otherwise CMP normal  ASSESSMENT/PLAN:  Alzheimer's dementia-advanced.  Hypertension-blood pressure borderline. Will review a log insomnia -continue trazodone. osteoarthritis-appears comfortable. chronic kidney disease-stable.  anxiety-stable. Check liver profile  CPT CODE: [id number]  Newton PiggGayani Y. Kerry Doryasanayaka, MD [facility] [phone]

## 2014-02-12 ENCOUNTER — Non-Acute Institutional Stay (SKILLED_NURSING_FACILITY): Payer: Medicare Other | Admitting: Internal Medicine

## 2014-02-12 DIAGNOSIS — F0280 Dementia in other diseases classified elsewhere without behavioral disturbance: Secondary | ICD-10-CM

## 2014-02-12 DIAGNOSIS — M1990 Unspecified osteoarthritis, unspecified site: Secondary | ICD-10-CM

## 2014-02-12 DIAGNOSIS — G309 Alzheimer's disease, unspecified: Secondary | ICD-10-CM

## 2014-02-12 DIAGNOSIS — I150 Renovascular hypertension: Secondary | ICD-10-CM

## 2014-02-12 DIAGNOSIS — G4700 Insomnia, unspecified: Secondary | ICD-10-CM

## 2014-02-13 NOTE — Progress Notes (Signed)
        PROGRESS NOTE  DATE: 02-12-14  FACILITY: Nursing Home [facility]  LEVEL OF CARE: SNF (31)  Routine Visit  CHIEF COMPLAINT:  Manage Alzheimer's dementia, hypertension and insomnia  HISTORY OF PRESENT ILLNESS:  REASSESSMENT OF ONGOING PROBLEM(S):  DEMENTIA: The dementia remaines stable and continues to function adequately in the current living environment with supervision.  The patient has had little changes in behavior. No complications noted from the medications presently being used. Dementia is advanced.  Patient is a poor historian.  HTN: Pt 's HTN remains stable.  Staff Deny CP, sob, DOE, pedal edema, headaches, dizziness or visual disturbances.  No complications from the medications currently being used.  Last BP : 112/68, 152/72, 110/80, 128/92, 136/86,120/80, 148/90, 140/68, 140/98  INSOMNIA: The insomnia remains stable.  No complications noted from the medications presently being used. staff deny ongoing insomnia, pain, hallucinations, delusions.  PAST MEDICAL HISTORY : Reviewed.  No changes.  CURRENT MEDICATIONS: [facility] per [facility]  REVIEW OF SYSTEMS: Unobtainable due to dementia.  PHYSICAL EXAMINATION  VS: see vital sign section  GENERAL: no acute distress, thin body habitus EYES: Normal sclerae, normal conjunctivae, no discharge NECK: supple, trachea midline, no neck masses, no thyroid tenderness, no thyromegaly LYMPHATICS: No cervical lymphadenopathy, no supraclavicular lymphadenopathy RESPIRATORY: breathing is even & unlabored, BS CTAB CARDIAC: RRR, no murmur,no extra heart sounds, no edema GI: abdomen soft, normal BS, no masses, no tenderness, no hepatomegaly, no splenomegaly PSYCHIATRIC: the patient is alert & disoriented, affect & behavior appropriate  LABS/RADIOLOGY: 6-15 Depakote level 44, AST 44 otherwise liver profile normal 2-15 CBC normal, creatinine 1.35 otherwise BMP normal, vitamin D level 37.3 1-15 Depakote level  48 12-14 mcv 78 ow cbc nl, cr 1.4 ow cmp nl  8/14 Ca 10.2  7/14 depakote level 28 6/14 TIBC 234 ow iron panel nl, mcv 78 ow cbc nl, cr 1.39, Ca 10.9 ow cmp nl  2/14 creatinine 1.31, BUN 17, Depakote level 37 12/13 hemoglobin 11.6, MCV 81 otherwise CBC normal, creatinine 1.43 otherwise CMP normal  ASSESSMENT/PLAN:  Alzheimer's dementia-advanced.  Hypertension-uncontrolled. Start hydrochlorothiazide 25 mg daily insomnia -continue trazodone. osteoarthritis-appears comfortable. chronic kidney disease-stable.  anxiety-stable. Check BMP on 02-17-14  CPT CODE: [id number]  Newton PiggGayani Y. Kerry Doryasanayaka, MD [facility] [phone]

## 2014-02-14 ENCOUNTER — Non-Acute Institutional Stay (SKILLED_NURSING_FACILITY): Payer: Medicare Other | Admitting: Internal Medicine

## 2014-02-14 DIAGNOSIS — N189 Chronic kidney disease, unspecified: Secondary | ICD-10-CM

## 2014-02-14 DIAGNOSIS — E875 Hyperkalemia: Secondary | ICD-10-CM

## 2014-02-14 NOTE — Progress Notes (Signed)
Patient ID: Andrea Robbins, female   DOB: August 05, 1933, 80 y.o.   MRN: [id number] [facility] Chief complaint hyperkalemia History; the patient came to this facility from [place] in 2013. The exact circumstance is not really clear although she may have come from a another facility. Seen recently and hydrochlorothiazide was started for poorly controlled high blood pressure. Also a BMP was ordered to. This was done on 7:15 which showed a BUN of 28 a creatinine of 1.57 her potassium was greater than 10. This was brought to me this morning. As I could not see an obvious reason for the hyperkalemia other than the renal insufficiency I elected to order this repeated stat and it is returned at 4.1. BUN is 33 creatinine of 1.46 is more within what appears to be her normal range.  Physical examination Gen. the patient is in no distress. She is still able to walk with some assistance. Respiratory clear entry bilaterally Cardiac pulse was 86 O2 sat 95% on room air. Heart sounds are normal she appears to be euvolemic Abdomen no liver no spleen GU; bladder not distended there is no CVA tenderness. She is appearing to put out urine Extremities; minimal edema  Impression/plan #1 hyperkalemia. There was no obvious reason for this. She was not on ACE inhibitors or potassium. Repeat shows potassium within the normal range. #2 chronic renal insufficiency presumably secondary to hypertension. The addition of hydrochlorothiazide will need to be monitored to. She has a GFR of 48. The effective thiazides in this degree of renal insufficiency may be marginal. I am not sure that Mobic is the best drug in this setting as this is nonsteroidal drug and can contribute to that renal decline. We'll discontinue this drug

## 2014-03-03 ENCOUNTER — Non-Acute Institutional Stay (SKILLED_NURSING_FACILITY): Payer: Medicare Other | Admitting: Internal Medicine

## 2014-03-03 DIAGNOSIS — G4700 Insomnia, unspecified: Secondary | ICD-10-CM

## 2014-03-03 DIAGNOSIS — M1990 Unspecified osteoarthritis, unspecified site: Secondary | ICD-10-CM

## 2014-03-03 DIAGNOSIS — I150 Renovascular hypertension: Secondary | ICD-10-CM

## 2014-03-03 DIAGNOSIS — F0280 Dementia in other diseases classified elsewhere without behavioral disturbance: Secondary | ICD-10-CM

## 2014-03-03 DIAGNOSIS — G309 Alzheimer's disease, unspecified: Principal | ICD-10-CM

## 2014-03-04 NOTE — Progress Notes (Addendum)
        PROGRESS NOTE  DATE: 03-03-14  FACILITY: Nursing Home [facility]  LEVEL OF CARE: SNF (31)  Routine Visit  CHIEF COMPLAINT:  Manage Alzheimer's dementia, hypertension and insomnia  HISTORY OF PRESENT ILLNESS:  REASSESSMENT OF ONGOING PROBLEM(S):  DEMENTIA: The dementia remaines stable and continues to function adequately in the current living environment with supervision.  The patient has had little changes in behavior. No complications noted from the medications presently being used. Dementia is advanced.  Patient is a poor historian.  HTN: Pt 's HTN remains stable.  Staff Deny CP, sob, DOE, pedal edema, headaches, dizziness or visual disturbances.  No complications from the medications currently being used.  Last BP : 112/68, 152/72, 110/80, 128/92, 136/86,120/80, 148/90, 140/68, 140/98, 140/64  INSOMNIA: The insomnia remains stable.  No complications noted from the medications presently being used. staff deny ongoing insomnia, pain, hallucinations, delusions.  PAST MEDICAL HISTORY : Reviewed.  No changes.  CURRENT MEDICATIONS: [facility]  REVIEW OF SYSTEMS: Unobtainable due to dementia.  PHYSICAL EXAMINATION  VS: see vital sign section  GENERAL: no acute distress, thin body habitus EYES: Normal sclerae, normal conjunctivae, no discharge NECK: supple, trachea midline, no neck masses, no thyroid tenderness, no thyromegaly LYMPHATICS: No cervical lymphadenopathy, no supraclavicular lymphadenopathy RESPIRATORY: breathing is even & unlabored, BS CTAB CARDIAC: RRR, no murmur,no extra heart sounds, no edema GI: abdomen soft, normal BS, no masses, no tenderness, no hepatomegaly, no splenomegaly PSYCHIATRIC: the patient is alert & disoriented, affect & behavior appropriate  LABS/RADIOLOGY: 8-15 creatinine 1.35 otherwise BMP normal 6-15 Depakote level 44, AST 44 otherwise liver profile normal 2-15 CBC normal, creatinine 1.35 otherwise BMP  normal, vitamin D level 37.3 1-15 Depakote level 48 12-14 mcv 78 ow cbc nl, cr 1.4 ow cmp nl  8/14 Ca 10.2  7/14 depakote level 28 6/14 TIBC 234 ow iron panel nl, mcv 78 ow cbc nl, cr 1.39, Ca 10.9 ow cmp nl  2/14 creatinine 1.31, BUN 17, Depakote level 37 12/13 hemoglobin 11.6, MCV 81 otherwise CBC normal, creatinine 1.43 otherwise CMP normal  ASSESSMENT/PLAN:  Alzheimer's dementia-advanced.  Hypertension-uncontrolled. hydrochlorothiazide 25 mg daily was started. insomnia -continue trazodone. osteoarthritis-appears comfortable. chronic kidney disease-stable.  anxiety-stable. Check CBC  CPT CODE: [id number]  Newton PiggGayani Y. Kerry Doryasanayaka, MD [facility] [phone]

## 2014-04-21 ENCOUNTER — Non-Acute Institutional Stay (SKILLED_NURSING_FACILITY): Payer: Medicare Other | Admitting: Internal Medicine

## 2014-04-21 DIAGNOSIS — F0280 Dementia in other diseases classified elsewhere without behavioral disturbance: Secondary | ICD-10-CM

## 2014-04-21 DIAGNOSIS — M1990 Unspecified osteoarthritis, unspecified site: Secondary | ICD-10-CM

## 2014-04-21 DIAGNOSIS — I150 Renovascular hypertension: Secondary | ICD-10-CM

## 2014-04-21 DIAGNOSIS — G309 Alzheimer's disease, unspecified: Principal | ICD-10-CM

## 2014-04-21 DIAGNOSIS — G4700 Insomnia, unspecified: Secondary | ICD-10-CM

## 2014-04-22 NOTE — Progress Notes (Signed)
        PROGRESS NOTE  DATE: 04-21-14  FACILITY: Nursing Home [facility]  LEVEL OF CARE: SNF (31)  Routine Visit  CHIEF COMPLAINT:  Manage Alzheimer's dementia, hypertension and insomnia  HISTORY OF PRESENT ILLNESS:  REASSESSMENT OF ONGOING PROBLEM(S):  DEMENTIA: The dementia remaines stable and continues to function adequately in the current living environment with supervision.  The patient has had little changes in behavior. No complications noted from the medications presently being used. Dementia is advanced.  Patient is a poor historian.  HTN: Pt 's HTN remains stable.  Staff Deny CP, sob, DOE, pedal edema, headaches, dizziness or visual disturbances.  No complications from the medications currently being used.  Last BP : 112/68, 152/72, 110/80, 128/92, 136/86,120/80, 148/90, 140/68, 140/98, 140/64, 145/95  INSOMNIA: The insomnia remains stable.  No complications noted from the medications presently being used. staff deny ongoing insomnia, pain, hallucinations, delusions.  PAST MEDICAL HISTORY : Reviewed.  No changes.  CURRENT MEDICATIONS: Reviewed per [facility]  REVIEW OF SYSTEMS: Unobtainable due to dementia.  PHYSICAL EXAMINATION  VS: see vital sign section  GENERAL: no acute distress, thin body habitus NECK: supple, trachea midline, no neck masses, no thyroid tenderness, no thyromegaly RESPIRATORY: breathing is even & unlabored, BS CTAB CARDIAC: RRR, no murmur,no extra heart sounds, no edema GI: abdomen soft, normal BS, no masses, no tenderness, no hepatomegaly, no splenomegaly PSYCHIATRIC: the patient is alert & disoriented, affect & behavior appropriate  LABS/RADIOLOGY: 9-15 TIBC 285, serum iron level 39, iron saturation 14, ferritin 101 8-15 creatinine 1.35 otherwise BMP normal 6-15 Depakote level 44, AST 44 otherwise liver profile normal 2-15 CBC normal, creatinine 1.35 otherwise BMP normal, vitamin D level 37.3 1-15 Depakote level  48 12-14 mcv 78 ow cbc nl, cr 1.4 ow cmp nl  8/14 Ca 10.2  7/14 depakote level 28 6/14 TIBC 234 ow iron panel nl, mcv 78 ow cbc nl, cr 1.39, Ca 10.9 ow cmp nl  2/14 creatinine 1.31, BUN 17, Depakote level 37 12/13 hemoglobin 11.6, MCV 81 otherwise CBC normal, creatinine 1.43 otherwise CMP normal  ASSESSMENT/PLAN:  Alzheimer's dementia-advanced.  Hypertension-blood pressure borderline insomnia -continue trazodone. osteoarthritis-appears comfortable. chronic kidney disease-stable.  anxiety-stable.  CPT CODE: 29562  Newton Pigg. Kerry Dory, MD [facility] [phone]

## 2014-05-14 ENCOUNTER — Non-Acute Institutional Stay (SKILLED_NURSING_FACILITY): Payer: Medicare Other | Admitting: Internal Medicine

## 2014-05-14 DIAGNOSIS — M159 Polyosteoarthritis, unspecified: Secondary | ICD-10-CM

## 2014-05-14 DIAGNOSIS — G309 Alzheimer's disease, unspecified: Secondary | ICD-10-CM

## 2014-05-14 DIAGNOSIS — F0280 Dementia in other diseases classified elsewhere without behavioral disturbance: Secondary | ICD-10-CM

## 2014-05-14 DIAGNOSIS — G4700 Insomnia, unspecified: Secondary | ICD-10-CM

## 2014-05-14 DIAGNOSIS — I150 Renovascular hypertension: Secondary | ICD-10-CM

## 2014-05-14 DIAGNOSIS — M150 Primary generalized (osteo)arthritis: Secondary | ICD-10-CM

## 2014-05-15 NOTE — Progress Notes (Signed)
        PROGRESS NOTE  DATE: 05-14-14  FACILITY: Nursing Home [facility]  LEVEL OF CARE: SNF (31)  Routine Visit  CHIEF COMPLAINT:  Manage Alzheimer's dementia, hypertension and insomnia  HISTORY OF PRESENT ILLNESS:  REASSESSMENT OF ONGOING PROBLEM(S):  DEMENTIA: The dementia remaines stable and continues to function adequately in the current living environment with supervision.  The patient has had little changes in behavior. No complications noted from the medications presently being used. Dementia is advanced.  Patient is a poor historian.  HTN: Pt 's HTN remains stable.  Staff Deny CP, sob, DOE, pedal edema, headaches, dizziness or visual disturbances.  No complications from the medications currently being used.  Last BP : 112/68, 152/72, 110/80, 128/92, 136/86,120/80, 148/90, 140/68, 140/98, 140/64, 145/95, 130/80  INSOMNIA: The insomnia remains stable.  No complications noted from the medications presently being used. staff deny ongoing insomnia, pain, hallucinations, delusions.  PAST MEDICAL HISTORY : Reviewed.  No changes.  CURRENT MEDICATIONS: [facility]ed per [facility]  REVIEW OF SYSTEMS: Unobtainable due to dementia.  PHYSICAL EXAMINATION  VS: see vital sign section  GENERAL: no acute distress, thin body habitus NECK: supple, trachea midline, no neck masses, no thyroid tenderness, no thyromegaly RESPIRATORY: breathing is even & unlabored, BS CTAB CARDIAC: RRR, no murmur,no extra heart sounds, no edema GI: abdomen soft, normal BS, no masses, no tenderness, no hepatomegaly, no splenomegaly PSYCHIATRIC: the patient is alert & disoriented, affect & behavior appropriate  LABS/RADIOLOGY: 9-15 TIBC 285, serum iron level 39, iron saturation 14, ferritin 101 8-15 creatinine 1.35 otherwise BMP normal 6-15 Depakote level 44, AST 44 otherwise liver profile normal 2-15 CBC normal, creatinine 1.35 otherwise BMP normal, vitamin D level 37.3 1-15 Depakote  level 48 12-14 mcv 78 ow cbc nl, cr 1.4 ow cmp nl  8/14 Ca 10.2  7/14 depakote level 28 6/14 TIBC 234 ow iron panel nl, mcv 78 ow cbc nl, cr 1.39, Ca 10.9 ow cmp nl  2/14 creatinine 1.31, BUN 17, Depakote level 37 12/13 hemoglobin 11.6, MCV 81 otherwise CBC normal, creatinine 1.43 otherwise CMP normal  ASSESSMENT/PLAN:  Alzheimer's dementia-advanced.  Hypertension-well controlled insomnia -continue trazodone. osteoarthritis-appears comfortable. chronic kidney disease-stable.  anxiety-stable.  CPT CODE: [id number]  Newton PiggGayani Y. Kerry Doryasanayaka, MD [facility] [phone]

## 2014-05-26 ENCOUNTER — Non-Acute Institutional Stay (SKILLED_NURSING_FACILITY): Payer: Medicare Other | Admitting: Internal Medicine

## 2014-05-26 DIAGNOSIS — M79605 Pain in left leg: Secondary | ICD-10-CM

## 2014-05-27 ENCOUNTER — Other Ambulatory Visit: Payer: Self-pay

## 2014-05-27 MED ORDER — TRAMADOL HCL 50 MG PO TABS: 50.0000 mg | ORAL_TABLET | Freq: Two times a day (BID) | ORAL | Status: AC | PRN

## 2014-05-27 NOTE — Telephone Encounter (Signed)
Rx faxed to [facility] @ [phone], phone number [phone]  

## 2014-05-29 NOTE — Progress Notes (Signed)
Patient ID: Andrea Robbins, female   DOB: 12-12-1933, 80 y.o.   MRN: [id number]           PROGRESS NOTE  DATE: 05/26/2014         FACILITY:  [facility]   LEVEL OF CARE: SNF (31)  Acute Visit  CHIEF COMPLAINT:  Manage left lower extremity pain.    HISTORY OF PRESENT ILLNESS: I was requested by the staff to assess the patient regarding above problem(s):  Staff report that patient has been complaining of left lower extremity pain.  Due to advanced dementia, the patient is a poor historian.    PAST MEDICAL HISTORY : Reviewed.  No changes/see problem list  CURRENT MEDICATIONS: Reviewed per MAR/see medication list  REVIEW OF SYSTEMS:  Unobtainable due to dementia.      PHYSICAL EXAMINATION  VS: see VS section  GENERAL: no acute distress, normal body habitus RESPIRATORY: breathing is even & unlabored, BS CTAB CARDIAC: RRR, no murmur,no extra heart sounds, no edema MUSCULOSKELETAL: left lower extremity nontender to palpation   GI: abdomen soft, normal BS, no masses, no tenderness, no hepatomegaly, no splenomegaly    PSYCHIATRIC: the patient is alert, disoriented, affect and behavior appropriate          ASSESSMENT/PLAN:  Left lower extremity pain.  Patient is on scheduled Tylenol.  We will add Tramadol 50 mg b.i.d. p.r.n.     CPT CODE: [id number]          Angela CoxGayani Y Tywaun Hiltner, MD [facility] [phone]

## 2014-06-24 ENCOUNTER — Other Ambulatory Visit (HOSPITAL_COMMUNITY): Payer: Self-pay | Admitting: Internal Medicine

## 2014-06-24 DIAGNOSIS — R270 Ataxia, unspecified: Secondary | ICD-10-CM

## 2014-07-02 ENCOUNTER — Ambulatory Visit (HOSPITAL_COMMUNITY): Payer: Self-pay

## 2014-07-02 ENCOUNTER — Ambulatory Visit (HOSPITAL_COMMUNITY)
Admission: RE | Admit: 2014-07-02 | Discharge: 2014-07-02 | Disposition: A | Discharge status: Home / Self Care | Payer: Medicare Other | Source: Ambulatory Visit | Attending: Internal Medicine | Admitting: Internal Medicine

## 2014-07-02 DIAGNOSIS — R270 Ataxia, unspecified: Secondary | ICD-10-CM

## 2014-07-09 ENCOUNTER — Other Ambulatory Visit (HOSPITAL_COMMUNITY): Payer: Self-pay | Admitting: Internal Medicine

## 2014-07-09 ENCOUNTER — Ambulatory Visit (HOSPITAL_COMMUNITY)
Admission: RE | Admit: 2014-07-09 | Discharge: 2014-07-09 | Disposition: A | Discharge status: Home / Self Care | Payer: Medicare Other | Source: Ambulatory Visit | Attending: Internal Medicine | Admitting: Internal Medicine

## 2014-07-09 DIAGNOSIS — R270 Ataxia, unspecified: Secondary | ICD-10-CM | POA: Diagnosis not present

## 2014-12-26 IMAGING — CT CT HEAD W/O CM
2 series · 15 of 30 positions shown, 19 images · non-contrast
Comparison: None.

CLINICAL DATA: Ataxia.  Confusion.

EXAM:
CT HEAD WITHOUT CONTRAST
TECHNIQUE: Contiguous axial images were obtained from the base of the skull
through the vertex without intravenous contrast.

[Series 201: head w/o, idose (1) · axial · non-contrast · 0.49mm/px · z∈[+1165,+1285]mm · 13 of 30 slices shown, 17 images]
[im 3/30  brain]
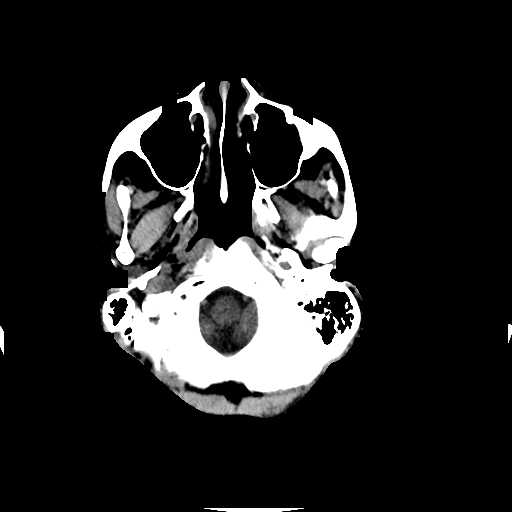
[im 3/30  bone]
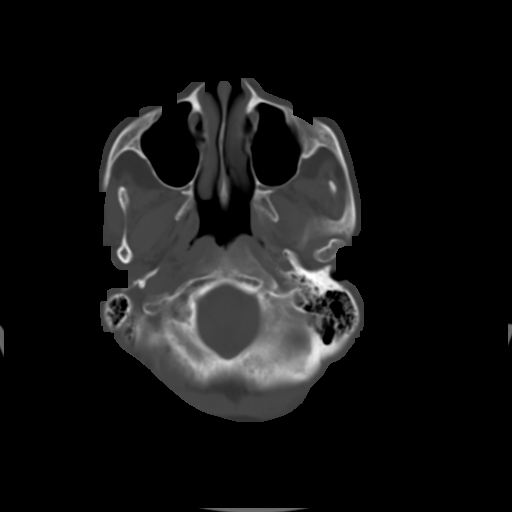
[im 5/30  brain]
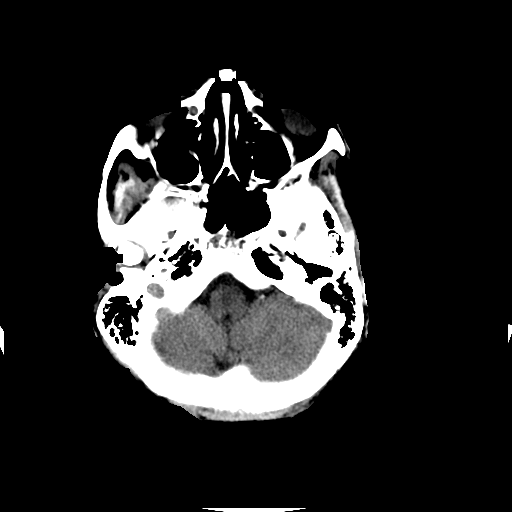
[im 7/30  brain]
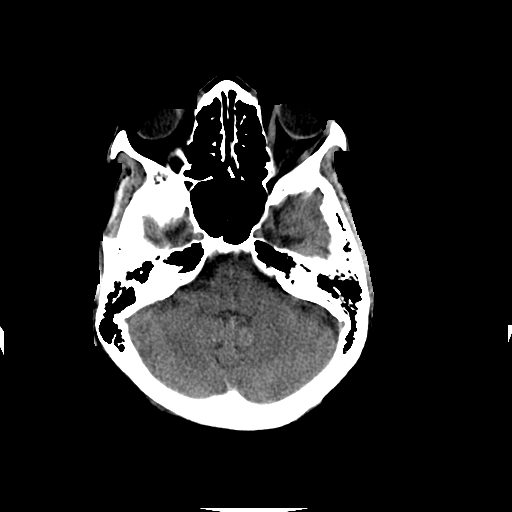
[im 9/30  brain]
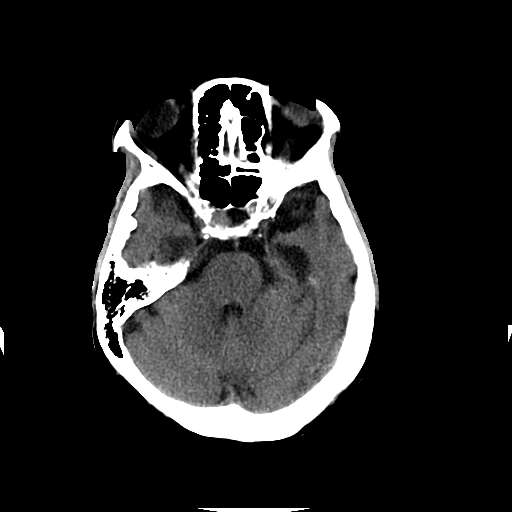
[im 11/30  brain]
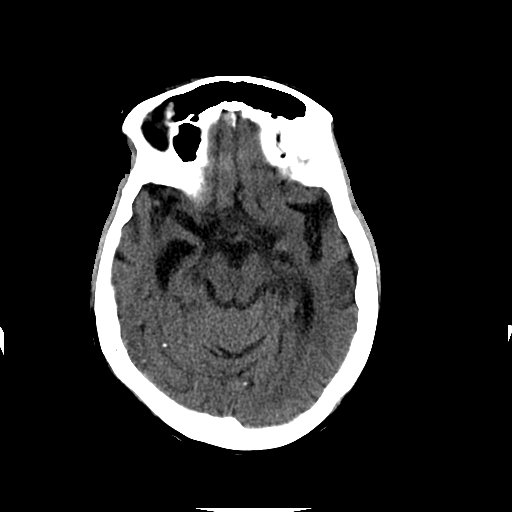
[im 11/30  bone]
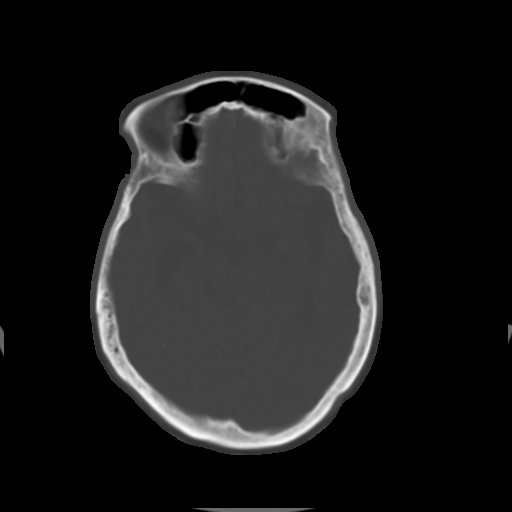
[im 13/30  brain]
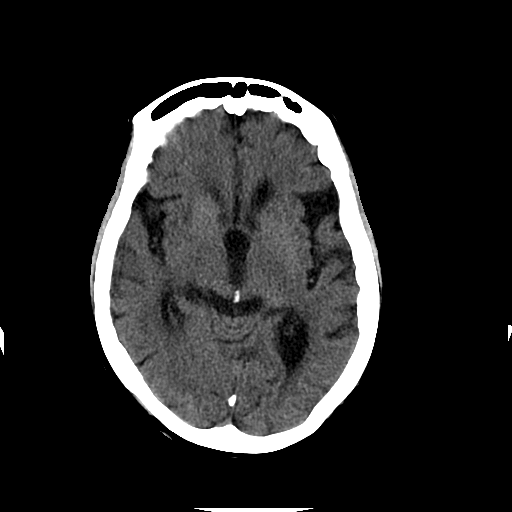
[im 15/30  brain]
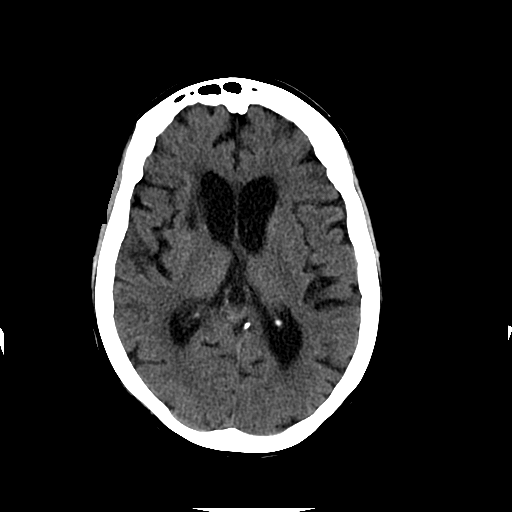
[im 17/30  brain]
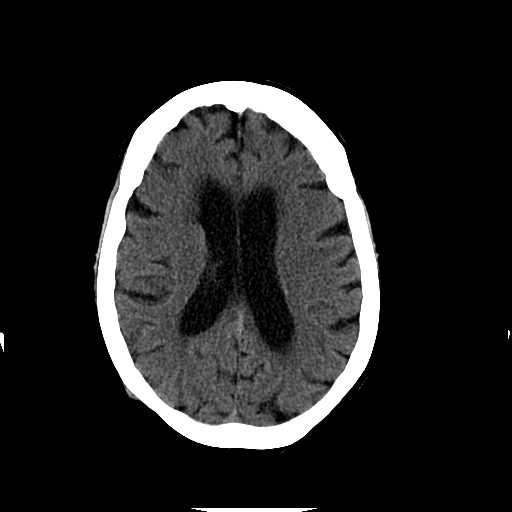
[im 19/30  brain]
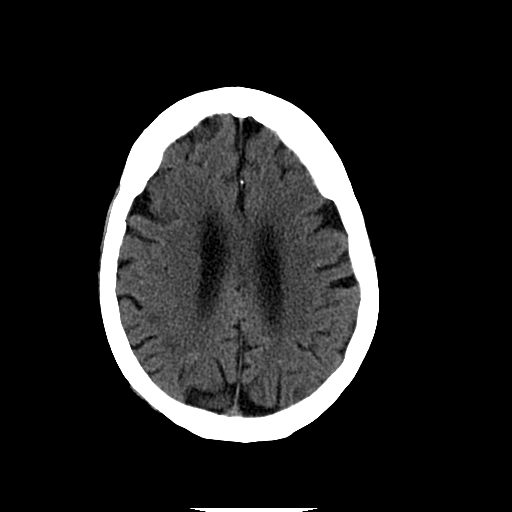
[im 19/30  bone]
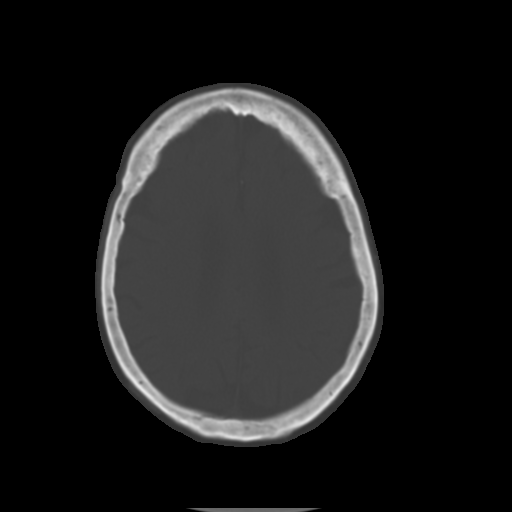
[im 21/30  brain]
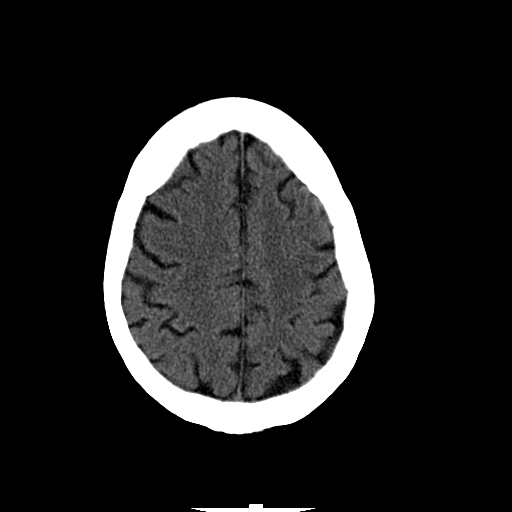
[im 23/30  brain]
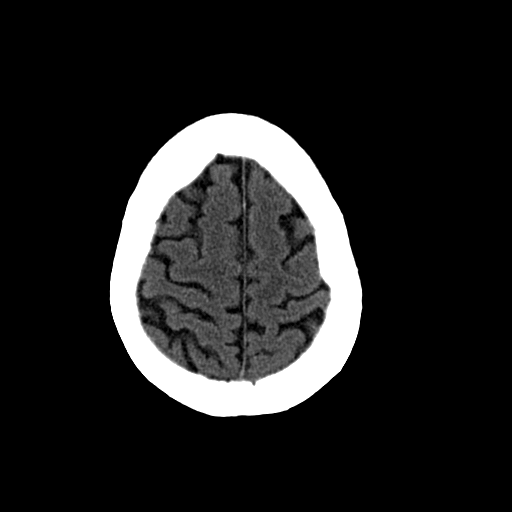
[im 25/30  brain]
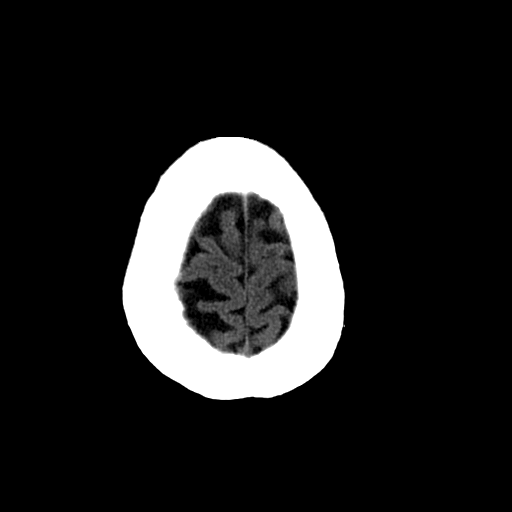
[im 27/30  brain]
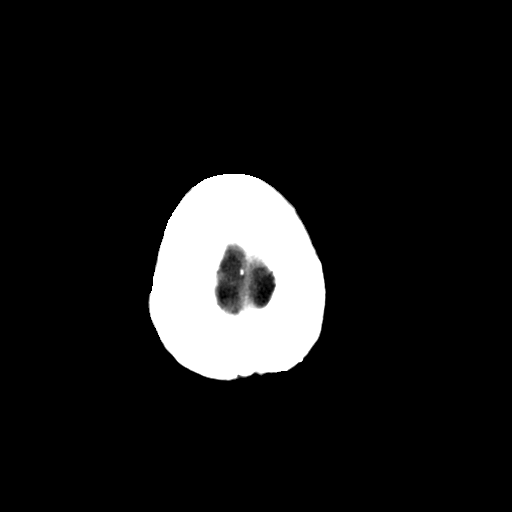
[im 27/30  bone]
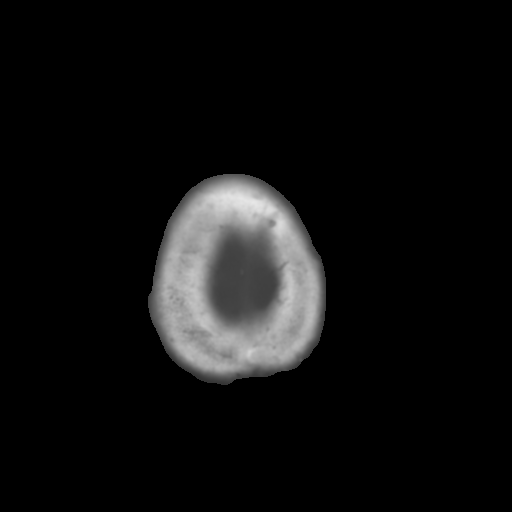

[Series 202: head w/o bone, idose (1) · axial · non-contrast · 0.49mm/px · z∈[+1165,+1185]mm · 2 of 30 slices shown]
[im 3/30  bone]
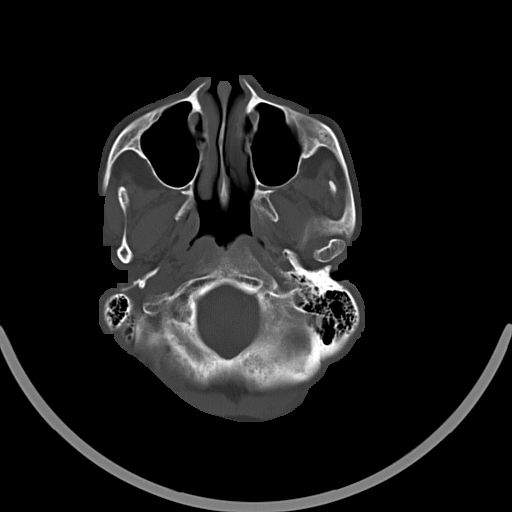
[im 7/30  bone]
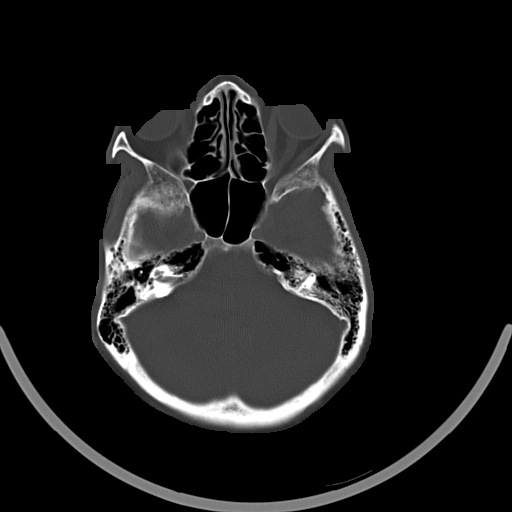

[15 of 30 positions shown; findings below may reference images not displayed]

FINDINGS: There is mild generalized cerebral atrophy. Remote right basal
ganglia infarct is noted with slight ex vacuo dilatation of the
right frontal horn. Periventricular white-matter hypodensities are
nonspecific but compatible with mild chronic small vessel ischemic
disease. There is no evidence of acute cortical infarct,
intracranial hemorrhage, intra-axial mass, midline shift, or
extra-axial fluid collection. 1.2 cm pineal cyst is suspected.

Orbits are unremarkable. Visualized mastoid air cells are clear.
Small right maxillary sinus mucous retention cyst is noted. Mild
carotid siphon calcification is present.
IMPRESSION: 1. No evidence of acute intracranial abnormality or mass effect.
2. Remote right basal ganglia infarct and mild cerebral atrophy.

## 2017-07-01 DEATH — deceased
